# Patient Record
Sex: Male | Born: 1985 | Race: White | Hispanic: No | Marital: Single | Smoking: Never smoker
Health system: Southern US, Community
[De-identification: ages and names within clinical notes are randomized; demographics above are authoritative.]

---

## 2008-05-15 ENCOUNTER — Emergency Department (HOSPITAL_COMMUNITY): Admission: EM | Admit: 2008-05-15 | Discharge: 2008-05-15 | Payer: Self-pay | Admitting: Emergency Medicine

## 2008-12-05 ENCOUNTER — Emergency Department (HOSPITAL_COMMUNITY): Admission: EM | Admit: 2008-12-05 | Discharge: 2008-12-05 | Payer: Self-pay | Admitting: Emergency Medicine

## 2009-11-27 ENCOUNTER — Emergency Department (HOSPITAL_COMMUNITY): Admission: EM | Admit: 2009-11-27 | Discharge: 2009-11-27 | Payer: Self-pay | Admitting: Family Medicine

## 2010-12-27 LAB — POCT I-STAT, CHEM 8
Calcium, Ion: 1.11 mmol/L — ABNORMAL LOW (ref 1.12–1.32)
Chloride: 106 mEq/L (ref 96–112)
Glucose, Bld: 99 mg/dL (ref 70–99)
HCT: 45 % (ref 39.0–52.0)

## 2011-07-06 LAB — URINALYSIS, ROUTINE W REFLEX MICROSCOPIC
Bilirubin Urine: NEGATIVE
Ketones, ur: NEGATIVE
Nitrite: NEGATIVE
pH: 7

## 2011-07-06 LAB — GC/CHLAMYDIA PROBE AMP, GENITAL
Chlamydia, DNA Probe: NEGATIVE
GC Probe Amp, Genital: POSITIVE — AB

## 2011-07-06 LAB — URINE MICROSCOPIC-ADD ON

## 2013-07-24 ENCOUNTER — Emergency Department (HOSPITAL_BASED_OUTPATIENT_CLINIC_OR_DEPARTMENT_OTHER): Payer: No Typology Code available for payment source

## 2013-07-24 ENCOUNTER — Emergency Department (HOSPITAL_BASED_OUTPATIENT_CLINIC_OR_DEPARTMENT_OTHER)
Admission: EM | Admit: 2013-07-24 | Discharge: 2013-07-24 | Disposition: A | Discharge status: Home / Self Care | Payer: No Typology Code available for payment source | Attending: Emergency Medicine | Admitting: Emergency Medicine

## 2013-07-24 ENCOUNTER — Encounter (HOSPITAL_BASED_OUTPATIENT_CLINIC_OR_DEPARTMENT_OTHER): Payer: Self-pay | Admitting: Emergency Medicine

## 2013-07-24 DIAGNOSIS — S0590XA Unspecified injury of unspecified eye and orbit, initial encounter: Secondary | ICD-10-CM | POA: Insufficient documentation

## 2013-07-24 DIAGNOSIS — S335XXA Sprain of ligaments of lumbar spine, initial encounter: Secondary | ICD-10-CM | POA: Insufficient documentation

## 2013-07-24 DIAGNOSIS — S161XXA Strain of muscle, fascia and tendon at neck level, initial encounter: Secondary | ICD-10-CM

## 2013-07-24 DIAGNOSIS — Y92410 Unspecified street and highway as the place of occurrence of the external cause: Secondary | ICD-10-CM | POA: Insufficient documentation

## 2013-07-24 DIAGNOSIS — Y9389 Activity, other specified: Secondary | ICD-10-CM | POA: Insufficient documentation

## 2013-07-24 DIAGNOSIS — Z23 Encounter for immunization: Secondary | ICD-10-CM | POA: Insufficient documentation

## 2013-07-24 DIAGNOSIS — T07XXXA Unspecified multiple injuries, initial encounter: Secondary | ICD-10-CM

## 2013-07-24 DIAGNOSIS — IMO0002 Reserved for concepts with insufficient information to code with codable children: Secondary | ICD-10-CM | POA: Insufficient documentation

## 2013-07-24 DIAGNOSIS — R51 Headache: Secondary | ICD-10-CM | POA: Insufficient documentation

## 2013-07-24 DIAGNOSIS — S39012A Strain of muscle, fascia and tendon of lower back, initial encounter: Secondary | ICD-10-CM

## 2013-07-24 DIAGNOSIS — Z79899 Other long term (current) drug therapy: Secondary | ICD-10-CM | POA: Insufficient documentation

## 2013-07-24 DIAGNOSIS — S139XXA Sprain of joints and ligaments of unspecified parts of neck, initial encounter: Secondary | ICD-10-CM | POA: Insufficient documentation

## 2013-07-24 MED ORDER — HYDROCODONE-ACETAMINOPHEN 5-325 MG PO TABS
2.0000 | ORAL_TABLET | Freq: Once | ORAL | Status: AC
  Administered 2013-07-24: 2 via ORAL

## 2013-07-24 MED ORDER — TETANUS-DIPHTH-ACELL PERTUSSIS 5-2.5-18.5 LF-MCG/0.5 IM SUSP
0.5000 mL | Freq: Once | INTRAMUSCULAR | Status: AC
  Administered 2013-07-24: 0.5 mL via INTRAMUSCULAR
  Filled 2013-07-24: qty 0.5

## 2013-07-24 MED ORDER — HYDROCODONE-ACETAMINOPHEN 5-325 MG PO TABS
ORAL_TABLET | ORAL | Status: AC
  Filled 2013-07-24: qty 2

## 2013-07-24 MED ORDER — HYDROCODONE-ACETAMINOPHEN 5-325 MG PO TABS
2.0000 | ORAL_TABLET | ORAL | Status: AC | PRN
Start: 2013-07-24 — End: ?

## 2013-07-24 NOTE — ED Notes (Signed)
Bruising and abrasions to left eye as well as redness to sclera. Earrings in left ear "got knocked out" blood at one earring site.  Vision improved since arrival. Pain in left shoulder. Pain in lower back "where my spine meets my pelvis". Respirations even and unlabored.

## 2013-07-24 NOTE — ED Provider Notes (Signed)
Medical screening examination/treatment/procedure(s) were performed by non-physician practitioner and as supervising physician I was immediately available for consultation/collaboration.   Rolan Bucco, MD 07/24/13 2256

## 2013-07-24 NOTE — ED Notes (Signed)
Patient transported to X-ray 
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
## 2019-09-19 ENCOUNTER — Other Ambulatory Visit: Payer: Self-pay

## 2019-09-19 ENCOUNTER — Emergency Department (HOSPITAL_COMMUNITY)
Admission: EM | Admit: 2019-09-19 | Discharge: 2019-09-20 | Disposition: A | Discharge status: Home / Self Care | Payer: Self-pay | Attending: Emergency Medicine | Admitting: Emergency Medicine

## 2019-09-19 ENCOUNTER — Encounter (HOSPITAL_COMMUNITY): Payer: Self-pay | Admitting: Emergency Medicine

## 2019-09-19 DIAGNOSIS — R002 Palpitations: Secondary | ICD-10-CM | POA: Insufficient documentation

## 2019-09-19 DIAGNOSIS — Z79899 Other long term (current) drug therapy: Secondary | ICD-10-CM | POA: Insufficient documentation

## 2019-09-19 DIAGNOSIS — R251 Tremor, unspecified: Secondary | ICD-10-CM | POA: Insufficient documentation

## 2019-09-19 DIAGNOSIS — R531 Weakness: Secondary | ICD-10-CM | POA: Insufficient documentation

## 2019-09-19 LAB — BASIC METABOLIC PANEL
Anion gap: 15 (ref 5–15)
BUN: 10 mg/dL (ref 6–20)
CO2: 27 mmol/L (ref 22–32)
Calcium: 9.6 mg/dL (ref 8.9–10.3)
Chloride: 96 mmol/L — ABNORMAL LOW (ref 98–111)
Creatinine, Ser: 0.63 mg/dL (ref 0.61–1.24)
GFR calc Af Amer: >60 mL/min (ref >60)
GFR calc non Af Amer: >60 mL/min (ref >60)
Glucose, Bld: 131 mg/dL — ABNORMAL HIGH (ref 70–99)
Potassium: 3.8 mmol/L (ref 3.5–5.1)
Sodium: 138 mmol/L (ref 135–145)

## 2019-09-19 LAB — URINALYSIS, ROUTINE W REFLEX MICROSCOPIC
Bacteria, UA: NONE SEEN
Bilirubin Urine: NEGATIVE
Glucose, UA: NEGATIVE mg/dL
Hgb urine dipstick: NEGATIVE
Ketones, ur: 20 mg/dL — AB
Leukocytes,Ua: NEGATIVE
Nitrite: NEGATIVE
Protein, ur: 30 mg/dL — AB
Specific Gravity, Urine: 1.014 (ref 1.005–1.030)
pH: 8 (ref 5.0–8.0)

## 2019-09-19 LAB — CBC
HCT: 45.9 % (ref 39.0–52.0)
Hemoglobin: 16.2 g/dL (ref 13.0–17.0)
MCH: 35 pg — ABNORMAL HIGH (ref 26.0–34.0)
MCHC: 35.3 g/dL (ref 30.0–36.0)
MCV: 99.1 fL (ref 80.0–100.0)
Platelets: 144 10*3/uL — ABNORMAL LOW (ref 150–400)
RBC: 4.63 MIL/uL (ref 4.22–5.81)
RDW: 11.7 % (ref 11.5–15.5)
WBC: 3 10*3/uL — ABNORMAL LOW (ref 4.0–10.5)
nRBC: 0 % (ref 0.0–0.2)

## 2019-09-19 LAB — CBG MONITORING, ED
Glucose-Capillary: 143 mg/dL — ABNORMAL HIGH (ref 70–99)
Glucose-Capillary: 83 mg/dL (ref 70–99)

## 2019-09-19 MED ORDER — SODIUM CHLORIDE 0.9% FLUSH
3.0000 mL | Freq: Once | INTRAVENOUS | Status: AC
  Administered 2019-09-20: 3 mL via INTRAVENOUS

## 2019-09-19 NOTE — ED Triage Notes (Signed)
Pt reports having highs and lows in blood sugar over the last several months along with burning and tingling in extremities. Pt reports excessive sleeping. Pt has visual tremors at time of triage.

## 2019-09-19 NOTE — ED Notes (Addendum)
Pt has visible tremor and is anxious. Concerned about his blood glucose. Around 7:00 PM he was trembling, sweating, dizzy, experienced a little loss of vision, and nausea. He ate a banana around 8:00 PM but that didn't seem to help. He said, "I was diagnosed hypoglycemia as a kid. I've been having lows since I was a kid. I started having highs for 1-1.5 years. I use to keep glucose tablets on me but have stopped using those because of the highs I've been having."

## 2019-09-20 LAB — TSH: TSH: 1.412 u[IU]/mL (ref 0.350–4.500)

## 2019-09-20 LAB — HIV ANTIBODY (ROUTINE TESTING W REFLEX): HIV Screen 4th Generation wRfx: NONREACTIVE

## 2019-09-20 LAB — T4, FREE: Free T4: 0.68 ng/dL (ref 0.61–1.12)

## 2019-09-20 LAB — RPR: RPR Ser Ql: NONREACTIVE

## 2019-09-20 NOTE — ED Provider Notes (Signed)
[facility] DEPT Provider Note   CSN: [id number] Arrival date & time: 09/19/19  2107     History Chief Complaint  Patient presents with  . Weakness    Kenneth Byrd is a 33 y.o. male.  33 yo M with a chief complaints of 12 months of episodic periods where he will feel very tremulous and weak.  Last anywhere from a few hours to a few days.  Sometimes would have periods where his heart would race.  Occasional drinker.  Only would have a drink at a time.  Denies illegal drugs.  Takes B12 supplements.  Denies recent travel denies rash denies headache denies neck pain denies unilateral numbness or weakness.  The history is provided by the patient.  Weakness Severity:  Moderate Onset quality:  Gradual Duration:  52 weeks Timing:  Intermittent Progression:  Waxing and waning Chronicity:  New Context: not alcohol use, not change in medication, not drug use and not recent infection   Relieved by:  Nothing Worsened by:  Nothing Ineffective treatments:  None tried Associated symptoms: no abdominal pain, no arthralgias, no chest pain, no diarrhea, no fever, no headaches, no myalgias, no shortness of breath and no vomiting        History reviewed. No pertinent past medical history.  There are no problems to display for this patient.   History reviewed. No pertinent surgical history.     History reviewed. No pertinent family history.  Social History   Tobacco Use  . Smoking status: Never Smoker  . Smokeless tobacco: Never Used  Substance Use Topics  . Alcohol use: Yes  . Drug use: No    Home Medications Prior to Admission medications   Medication Sig Start Date End Date Taking? Authorizing Provider  HYDROcodone-acetaminophen (NORCO/VICODIN) 5-325 MG per tablet Take 2 tablets by mouth every 4 (four) hours as needed for pain. 07/24/13   Fransico Meadow, PA-C  Multiple Vitamin (MULTIVITAMIN WITH MINERALS) TABS tablet Take 1 tablet by mouth  daily.    [provider]    Allergies    Patient has no known allergies.  Review of Systems   Review of Systems  Constitutional: Negative for chills and fever.  HENT: Negative for congestion and facial swelling.   Eyes: Negative for discharge and visual disturbance.  Respiratory: Negative for shortness of breath.   Cardiovascular: Negative for chest pain and palpitations.  Gastrointestinal: Negative for abdominal pain, diarrhea and vomiting.  Musculoskeletal: Negative for arthralgias and myalgias.  Skin: Negative for color change and rash.  Neurological: Positive for tremors and weakness. Negative for syncope and headaches.  Psychiatric/Behavioral: Negative for confusion and dysphoric mood.    Physical Exam Updated Vital Signs BP (!) 142/94   Pulse 81   Temp 99.9 F (37.7 C) (Oral)   Resp 15   Ht 5\' 6"  (1.676 m)   Wt 56.7 kg   SpO2 100%   BMI 20.18 kg/m   Physical Exam Vitals and nursing note reviewed.  Constitutional:      Appearance: He is well-developed.  HENT:     Head: Normocephalic and atraumatic.  Eyes:     Pupils: Pupils are equal, round, and reactive to light.  Neck:     Vascular: No JVD.  Cardiovascular:     Rate and Rhythm: Normal rate and regular rhythm.     Heart sounds: No murmur. No friction rub. No gallop.   Pulmonary:     Effort: No respiratory distress.  Breath sounds: No wheezing.  Abdominal:     General: There is no distension.     Tenderness: There is no guarding or rebound.  Musculoskeletal:        General: Normal range of motion.     Cervical back: Normal range of motion and neck supple.  Skin:    Coloration: Skin is not pale.     Findings: No rash.  Neurological:     Mental Status: He is alert and oriented to person, place, and time.     Comments: Resting tremor most noticeable in the upper extremities.  Reflexes are 2+ and equal bilaterally to upper and lower extremities.  Psychiatric:        Behavior: Behavior  normal.     ED Results / Procedures / Treatments   Labs (all labs ordered are listed, but only abnormal results are displayed) Labs Reviewed  BASIC METABOLIC PANEL - Abnormal; Notable for the following components:      Result Value   Chloride 96 (*)    Glucose, Bld 131 (*)    All other components within normal limits  CBC - Abnormal; Notable for the following components:   WBC 3.0 (*)    MCH 35.0 (*)    Platelets 144 (*)    All other components within normal limits  URINALYSIS, ROUTINE W REFLEX MICROSCOPIC - Abnormal; Notable for the following components:   Ketones, ur 20 (*)    Protein, ur 30 (*)    All other components within normal limits  CBG MONITORING, ED - Abnormal; Notable for the following components:   Glucose-Capillary 143 (*)    All other components within normal limits  TSH  T4, FREE  RPR  HIV ANTIBODY (ROUTINE TESTING W REFLEX)  CBG MONITORING, ED    EKG EKG Interpretation  Date/Time:  Saturday September 19 2019 22:05:33 EST Ventricular Rate:  85 PR Interval:    QRS Duration: 81 QT Interval:  369 QTC Calculation: 439 R Axis:   84 Text Interpretation: Ectopic atrial rhythm Borderline short PR interval ST elev, probable normal early repol pattern Baseline wander in lead(s) II III aVL aVF No old tracing to compare Confirmed by Melene Plan [phone]) on 09/19/2019 11:07:12 PM   Radiology No results found.  Procedures Procedures (including critical care time)  Medications Ordered in ED Medications  sodium chloride flush (NS) 0.9 % injection 3 mL (has no administration in time range)    ED Course  I have reviewed the triage vital signs and the nursing notes.  Pertinent labs & imaging results that were available during my care of the patient were reviewed by me and considered in my medical decision making (see chart for details).    MDM Rules/Calculators/A&P     CHA2DS2/VAS Stroke Risk Points      N/A >= 2 Points: High Risk  1 - 1.99 Points: Medium  Risk  0 Points: Low Risk    A final score could not be computed because of missing components.: Last  Change: N/A     This score determines the patient's risk of having a stroke if the  patient has atrial fibrillation.      This score is not applicable to this patient. Components are not  calculated.                   33 yo M with a chief complaints of episodic periods where he feels weak.  Going on for about a year.  Hard to pinpoint  the exact cause seems to last for a short period of time up to a few days.  Seems less likely to be anxiety with a multiple day duration.  Patient is without obvious cause by history.  Will obtain a HIV and RPR.  Send off a thyroid level though this seems less likely to be hyperthyroidism as is not persistent and seems to come and go.  I suggested with him that he should follow-up with the family physician they could better evaluate this and determine if he is to see a rheumatologist or neurologist.  Patient states understanding.  PCP follow-up.  12:05 AM:  I have discussed the diagnosis/risks/treatment options with the patient and believe the pt to be eligible for discharge home to follow-up with PCP. We also discussed returning to the ED immediately if new or worsening sx occur. We discussed the sx which are most concerning (e.g., sudden worsening pain, fever, inability to tolerate by mouth) that necessitate immediate return. Medications administered to the patient during their visit and any new prescriptions provided to the patient are listed below.  Medications given during this visit Medications  sodium chloride flush (NS) 0.9 % injection 3 mL (has no administration in time range)     The patient appears reasonably screen and/or stabilized for discharge and I doubt any other medical condition or other [facility] requiring further screening, evaluation, or treatment in the ED at this time prior to discharge.     Final Clinical Impression(s) / ED Diagnoses Final  diagnoses:  Weakness    Rx / DC Orders ED Discharge Orders    None       Melene PlanFloyd, Emalene Welte, DO 09/20/19 0005

## 2019-09-20 NOTE — Discharge Instructions (Signed)
Please follow-up with a family doctor for this.  Please call the hotline number provided if you need help finding a family doctor.  Please return to the ED for persistent fever inability to eat or drink if you pass out start having chest pain or trouble breathing.

## 2019-09-20 NOTE — ED Notes (Signed)
Pt ate 21g peanut butter and 3 saltine crackers

## 2019-09-29 ENCOUNTER — Other Ambulatory Visit: Payer: Self-pay

## 2019-09-29 ENCOUNTER — Emergency Department (HOSPITAL_COMMUNITY)
Admission: EM | Admit: 2019-09-29 | Discharge: 2019-09-29 | Disposition: A | Discharge status: Home / Self Care | Payer: Self-pay | Attending: Emergency Medicine | Admitting: Emergency Medicine

## 2019-09-29 ENCOUNTER — Emergency Department (HOSPITAL_COMMUNITY): Payer: Self-pay

## 2019-09-29 ENCOUNTER — Encounter (HOSPITAL_COMMUNITY): Payer: Self-pay | Admitting: *Deleted

## 2019-09-29 DIAGNOSIS — F1099 Alcohol use, unspecified with unspecified alcohol-induced disorder: Secondary | ICD-10-CM | POA: Insufficient documentation

## 2019-09-29 DIAGNOSIS — F10929 Alcohol use, unspecified with intoxication, unspecified: Secondary | ICD-10-CM

## 2019-09-29 DIAGNOSIS — R4182 Altered mental status, unspecified: Secondary | ICD-10-CM | POA: Insufficient documentation

## 2019-09-29 LAB — RAPID URINE DRUG SCREEN, HOSP PERFORMED
Amphetamines: NOT DETECTED
Barbiturates: NOT DETECTED
Benzodiazepines: NOT DETECTED
Cocaine: NOT DETECTED
Opiates: NOT DETECTED
Tetrahydrocannabinol: NOT DETECTED

## 2019-09-29 LAB — CBC WITH DIFFERENTIAL/PLATELET
Abs Immature Granulocytes: 0.02 10*3/uL (ref 0.00–0.07)
Basophils Absolute: 0 10*3/uL (ref 0.0–0.1)
Basophils Relative: 1 %
Eosinophils Absolute: 0 10*3/uL (ref 0.0–0.5)
Eosinophils Relative: 1 %
HCT: 48.5 % (ref 39.0–52.0)
Hemoglobin: 16.7 g/dL (ref 13.0–17.0)
Immature Granulocytes: 1 %
Lymphocytes Relative: 50 %
Lymphs Abs: 2.1 10*3/uL (ref 0.7–4.0)
MCH: 34.2 pg — ABNORMAL HIGH (ref 26.0–34.0)
MCHC: 34.4 g/dL (ref 30.0–36.0)
MCV: 99.4 fL (ref 80.0–100.0)
Monocytes Absolute: 0.2 10*3/uL (ref 0.1–1.0)
Monocytes Relative: 5 %
Neutro Abs: 1.8 10*3/uL (ref 1.7–7.7)
Neutrophils Relative %: 42 %
Platelets: 191 10*3/uL (ref 150–400)
RBC: 4.88 MIL/uL (ref 4.22–5.81)
RDW: 11.8 % (ref 11.5–15.5)
WBC: 4.2 10*3/uL (ref 4.0–10.5)
nRBC: 0 % (ref 0.0–0.2)

## 2019-09-29 LAB — COMPREHENSIVE METABOLIC PANEL
ALT: 175 U/L — ABNORMAL HIGH (ref 0–44)
AST: 178 U/L — ABNORMAL HIGH (ref 15–41)
Albumin: 5 g/dL (ref 3.5–5.0)
Alkaline Phosphatase: 70 U/L (ref 38–126)
Anion gap: 23 — ABNORMAL HIGH (ref 5–15)
BUN: 8 mg/dL (ref 6–20)
CO2: 19 mmol/L — ABNORMAL LOW (ref 22–32)
Calcium: 9.5 mg/dL (ref 8.9–10.3)
Chloride: 103 mmol/L (ref 98–111)
Creatinine, Ser: 0.62 mg/dL (ref 0.61–1.24)
GFR calc Af Amer: >60 mL/min (ref >60)
GFR calc non Af Amer: >60 mL/min (ref >60)
Glucose, Bld: 71 mg/dL (ref 70–99)
Potassium: 3.6 mmol/L (ref 3.5–5.1)
Sodium: 145 mmol/L (ref 135–145)
Total Bilirubin: 1.7 mg/dL — ABNORMAL HIGH (ref 0.3–1.2)
Total Protein: 7.4 g/dL (ref 6.5–8.1)

## 2019-09-29 LAB — CBG MONITORING, ED: Glucose-Capillary: 73 mg/dL (ref 70–99)

## 2019-09-29 LAB — ETHANOL: Alcohol, Ethyl (B): 382 mg/dL (ref <10)
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
## 2019-09-29 NOTE — ED Notes (Signed)
The partner for Res A, Higinio Roger called and left his phone number, if you have any more questions, call [phone].

## 2019-09-29 NOTE — ED Triage Notes (Addendum)
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 Pt in triage shaking and not real clear as to what has happened today. LAST SEEN NORMAL AT 0900 Partner also states pts face was drooped as well as stuttering/slurring words when he went home.

## 2019-10-12 ENCOUNTER — Ambulatory Visit: Payer: Medicaid Other | Attending: Internal Medicine

## 2019-10-12 DIAGNOSIS — Z20822 Contact with and (suspected) exposure to covid-19: Secondary | ICD-10-CM

## 2019-10-13 LAB — NOVEL CORONAVIRUS, NAA: SARS-CoV-2, NAA: NOT DETECTED

## 2021-05-15 IMAGING — CT CT HEAD W/O CM
3 series · 16 of 47 positions shown, 19 images · non-contrast
Comparison: None.

CLINICAL DATA: Altered mental status, unclear cause

EXAM:
CT HEAD WITHOUT CONTRAST
TECHNIQUE: Contiguous axial images were obtained from the base of the skull
through the vertex without intravenous contrast.

[Series 2: head wo · axial · 0.50mm/px · z∈[+1668,+1793]mm · 10 of 31 slices shown, 13 images]
[im 3/31  brain]
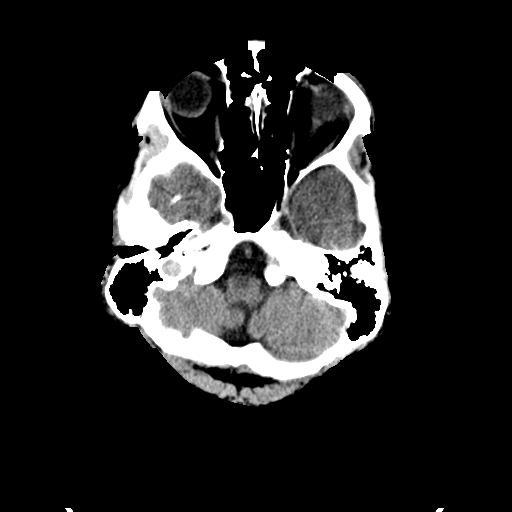
[im 3/31  bone]
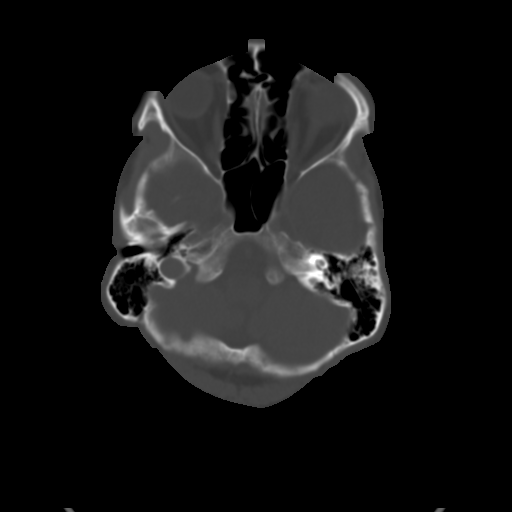
[im 6/31  brain]
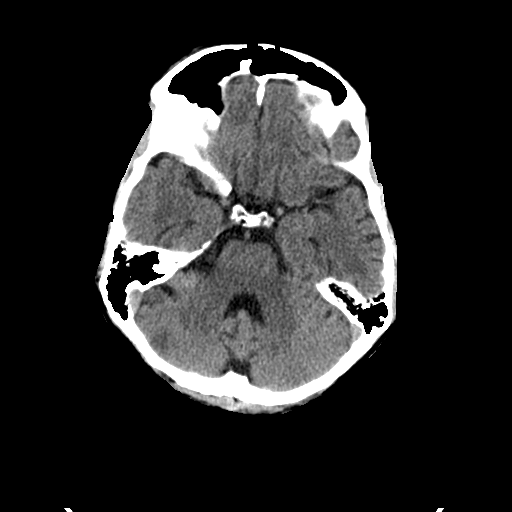
[im 9/31  brain]
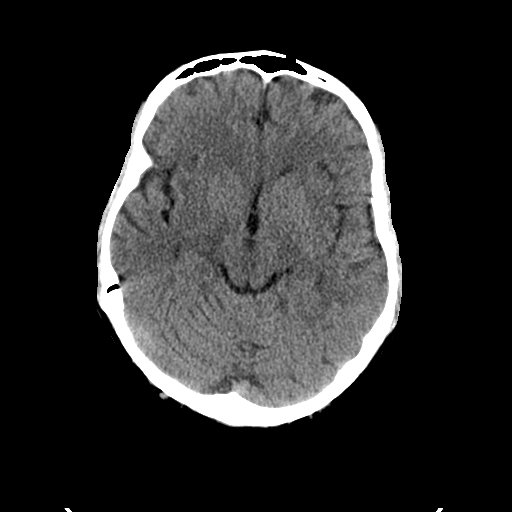
[im 11/31  brain]
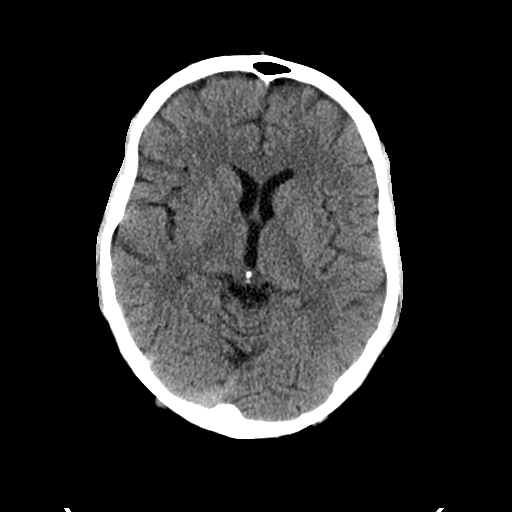
[im 14/31  brain]
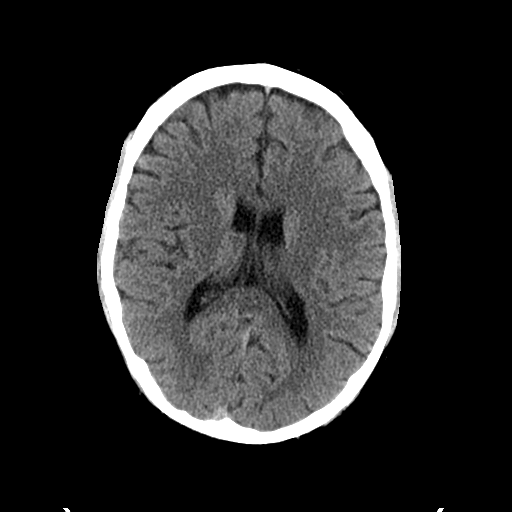
[im 14/31  bone]
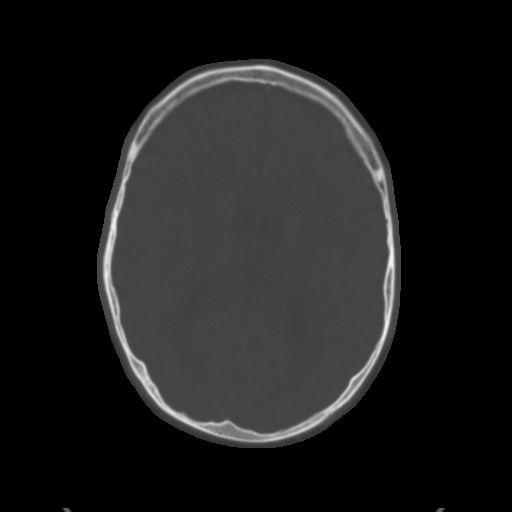
[im 17/31  brain]
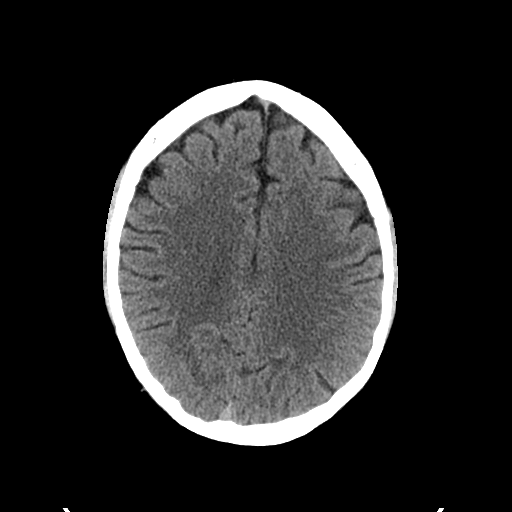
[im 20/31  brain]
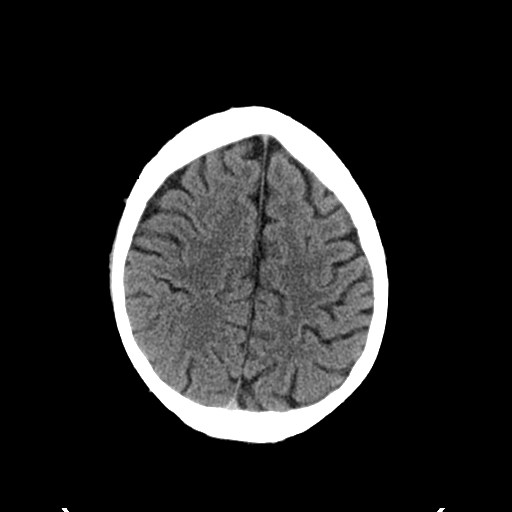
[im 23/31  brain]
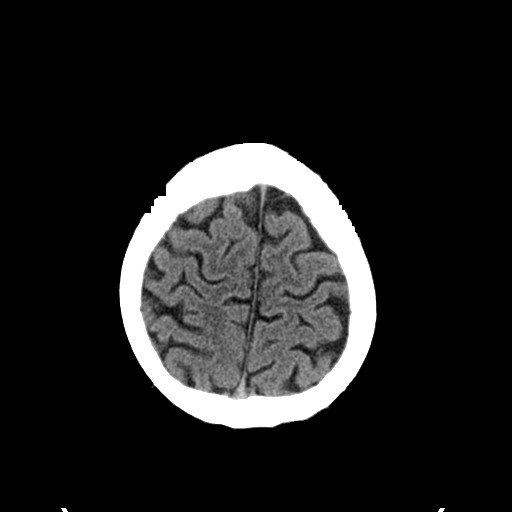
[im 25/31  brain]
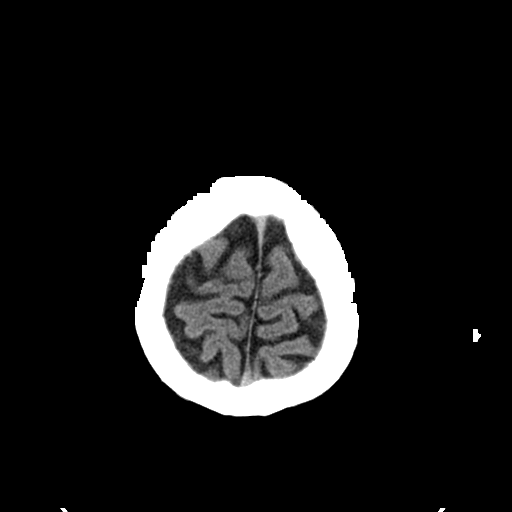
[im 25/31  bone]
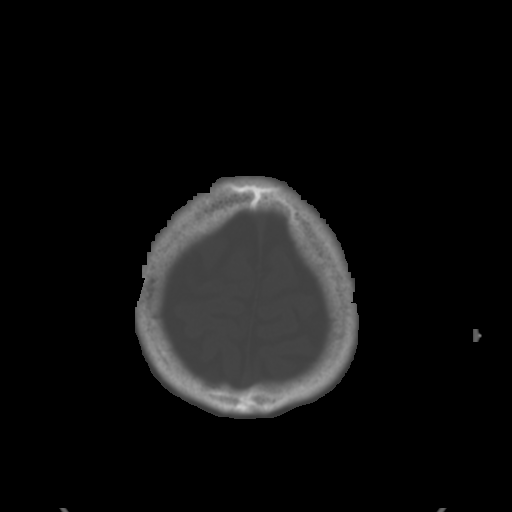
[im 28/31  brain]
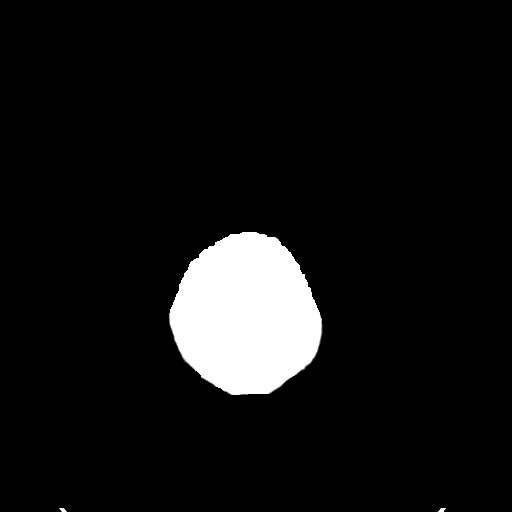

[Series 5: coronal soft tissue · coronal · 0.30mm/px · 3 of 68 slices shown]
[im 23/68  brain]
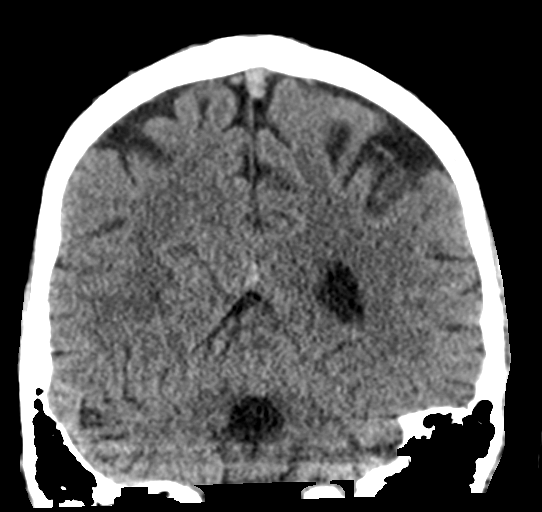
[im 30/68  brain]
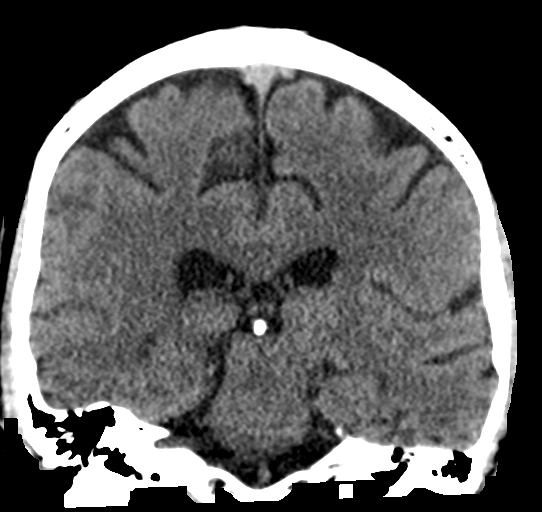
[im 38/68  brain]
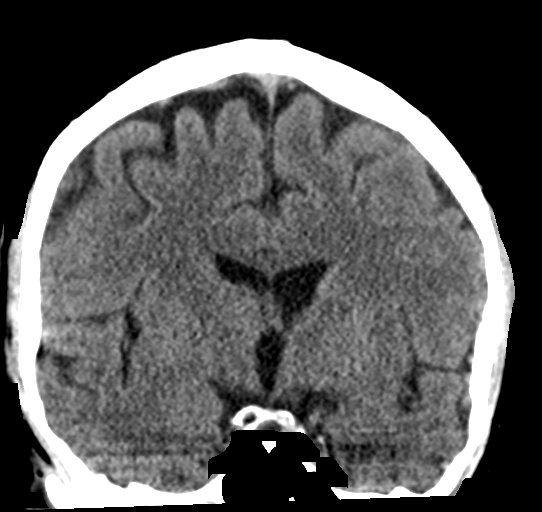

[Series 6: sagittal soft tissue · sagittal · 0.29mm/px · 3 of 52 slices shown]
[im 18/52  brain]
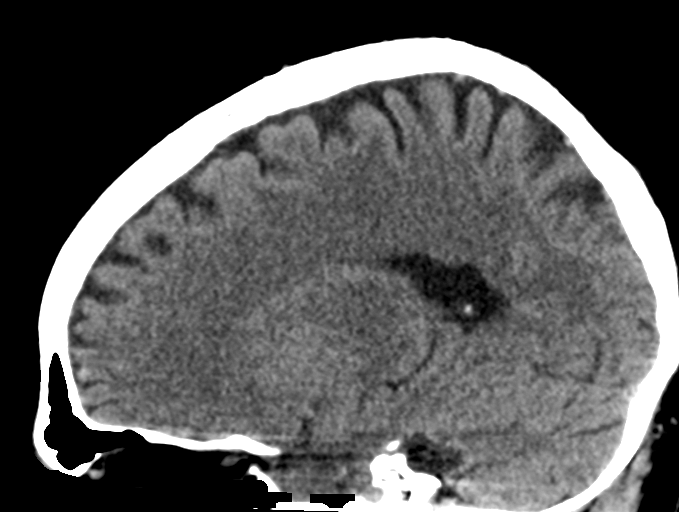
[im 26/52  brain]
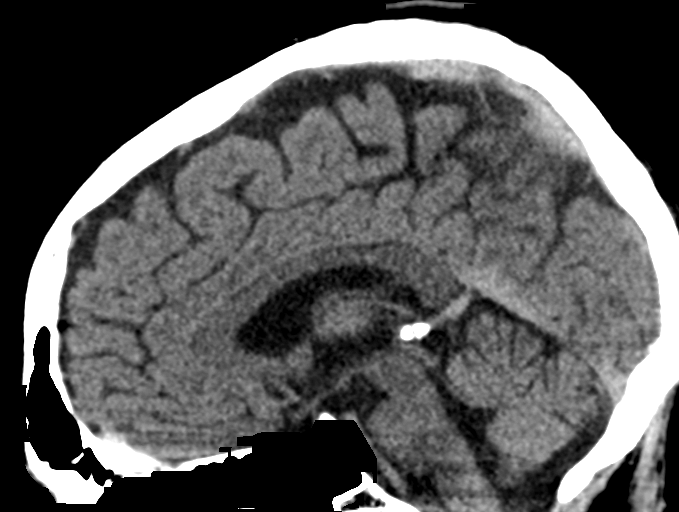
[im 35/52  brain]
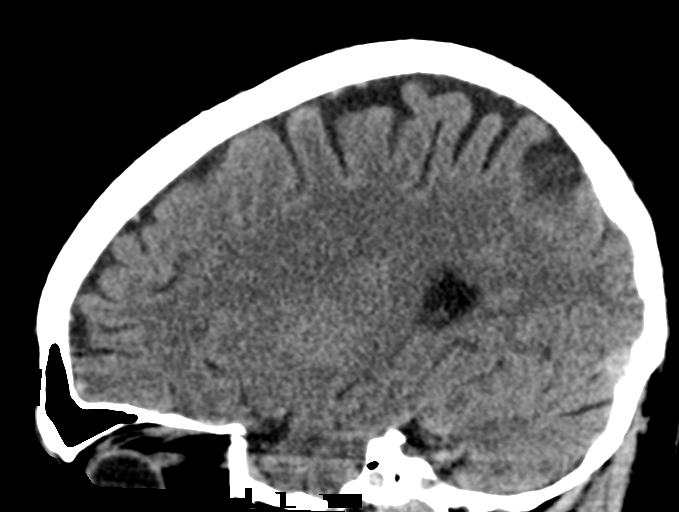

[16 of 47 positions shown; findings below may reference images not displayed]

FINDINGS: Brain: No evidence of acute infarction, hemorrhage, hydrocephalus,
extra-axial collection or mass lesion/mass effect.

Vascular: No hyperdense vessel or unexpected calcification.

Skull: Normal. Negative for fracture or focal lesion.

Sinuses/Orbits: Visualized sinuses and orbits are unremarkable,
incompletely imaged.

Other: None.
IMPRESSION: No acute intracranial abnormality.

## 2021-06-16 ENCOUNTER — Ambulatory Visit (INDEPENDENT_AMBULATORY_CARE_PROVIDER_SITE_OTHER): Payer: Self-pay

## 2021-06-16 ENCOUNTER — Other Ambulatory Visit: Payer: Self-pay

## 2021-06-16 DIAGNOSIS — Z23 Encounter for immunization: Secondary | ICD-10-CM

## 2021-06-16 NOTE — Progress Notes (Signed)
    [facility]  Name:  Kenneth Byrd    MRN: [id number] DOB: [birth year]   06/16/2021  Kenneth Byrd was observed post JYNNEOS immunization for 15 minutes without incident. He was provided with Vaccine Information Sheet and instruction to access the V-Safe system.   Kenneth Byrd was instructed to call 911 with any severe reactions post vaccine: Difficulty breathing  Swelling of face and throat  A fast heartbeat  A bad rash all over body  Dizziness and weakness

## 2021-07-28 ENCOUNTER — Ambulatory Visit: Payer: Self-pay

## 2021-08-17 ENCOUNTER — Telehealth: Payer: Self-pay

## 2021-08-17 NOTE — Telephone Encounter (Signed)
Patient offered 2nd MPX vaccine and accepts appointment 11/15. Appointment information provided. Patient verbalized understanding. Kenneth Byrd

## 2021-08-22 ENCOUNTER — Ambulatory Visit: Payer: Self-pay
